# Patient Record
Sex: Male | Born: 1990
Health system: Southern US, Community
[De-identification: ages and names within clinical notes are randomized; demographics above are authoritative.]

## PROBLEM LIST (undated history)

## (undated) DIAGNOSIS — F909 Attention-deficit hyperactivity disorder, unspecified type: Secondary | ICD-10-CM

## (undated) DIAGNOSIS — K219 Gastro-esophageal reflux disease without esophagitis: Secondary | ICD-10-CM

---
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
## 2010-02-16 ENCOUNTER — Encounter: Payer: Self-pay | Admitting: Orthopedic Surgery

## 2013-05-19 ENCOUNTER — Emergency Department (HOSPITAL_COMMUNITY)
Admission: EM | Admit: 2013-05-19 | Discharge: 2013-05-19 | Disposition: A | Discharge status: Home / Self Care | Payer: Non-veteran care | Attending: Emergency Medicine | Admitting: Emergency Medicine

## 2013-05-19 ENCOUNTER — Encounter (HOSPITAL_COMMUNITY): Payer: Self-pay | Admitting: Emergency Medicine

## 2013-05-19 DIAGNOSIS — Y929 Unspecified place or not applicable: Secondary | ICD-10-CM | POA: Insufficient documentation

## 2013-05-19 DIAGNOSIS — Y9389 Activity, other specified: Secondary | ICD-10-CM | POA: Insufficient documentation

## 2013-05-19 DIAGNOSIS — S81809A Unspecified open wound, unspecified lower leg, initial encounter: Principal | ICD-10-CM

## 2013-05-19 DIAGNOSIS — S91009A Unspecified open wound, unspecified ankle, initial encounter: Principal | ICD-10-CM

## 2013-05-19 DIAGNOSIS — Z8719 Personal history of other diseases of the digestive system: Secondary | ICD-10-CM | POA: Insufficient documentation

## 2013-05-19 DIAGNOSIS — Z23 Encounter for immunization: Secondary | ICD-10-CM | POA: Insufficient documentation

## 2013-05-19 DIAGNOSIS — IMO0002 Reserved for concepts with insufficient information to code with codable children: Secondary | ICD-10-CM

## 2013-05-19 DIAGNOSIS — F17200 Nicotine dependence, unspecified, uncomplicated: Secondary | ICD-10-CM | POA: Insufficient documentation

## 2013-05-19 DIAGNOSIS — S81009A Unspecified open wound, unspecified knee, initial encounter: Secondary | ICD-10-CM | POA: Insufficient documentation

## 2013-05-19 DIAGNOSIS — W268XXA Contact with other sharp object(s), not elsewhere classified, initial encounter: Secondary | ICD-10-CM | POA: Insufficient documentation

## 2013-05-19 HISTORY — DX: Gastro-esophageal reflux disease without esophagitis: K21.9

## 2013-05-19 MED ORDER — TETANUS-DIPHTH-ACELL PERTUSSIS 5-2.5-18.5 LF-MCG/0.5 IM SUSP
0.5000 mL | Freq: Once | INTRAMUSCULAR | Status: AC
  Administered 2013-05-19: 0.5 mL via INTRAMUSCULAR
  Filled 2013-05-19: qty 0.5

## 2013-05-19 MED ORDER — CEPHALEXIN 500 MG PO CAPS: 500.0000 mg | ORAL_CAPSULE | Freq: Four times a day (QID) | ORAL | Status: DC

## 2013-05-19 NOTE — Discharge Instructions (Signed)
A laceration is a cut or lesion that goes through all layers of the skin and into the tissue just beneath the skin. This may have been repaired by your caregiver.  SEEK MEDICAL ATTENTION IF:  You may take your antibiotic if you develop signs of infection. Follow up in 7 days for suture removal or sooner if you need to be seen.  There is redness, swelling, increasing pain in the wound  There is a red line that goes up your arm or leg.  Pus is coming from wound.  You develop an unexplained temperature above 100.4.  You notice a foul smell coming from the wound or dressing.  There is a breaking open of the wound (edges not staying together) after sutures have been removed. If you did not receive a tetanus shot today because you thought you were up to date, but did not recall when your last one was given, make sure to check with your primary caregiver to determine if you need one.    Laceration Care, Adult A laceration is a cut or lesion that goes through all layers of the skin and into the tissue just beneath the skin. TREATMENT  Some lacerations may not require closure. Some lacerations may not be able to be closed due to an increased risk of infection. It is important to see your caregiver as soon as possible after an injury to minimize the risk of infection and maximize the opportunity for successful closure. If closure is appropriate, pain medicines may be given, if needed. The wound will be cleaned to help prevent infection. Your caregiver will use stitches (sutures), staples, wound glue (adhesive), or skin adhesive strips to repair the laceration. These tools bring the skin edges together to allow for faster healing and a better cosmetic outcome. However, all wounds will heal with a scar. Once the wound has healed, scarring can be minimized by covering the wound with sunscreen during the day for 1 full year. HOME CARE INSTRUCTIONS  For sutures or staples:  Keep the wound clean and  dry.  If you were given a bandage (dressing), you should change it at least once a day. Also, change the dressing if it becomes wet or dirty, or as directed by your caregiver.  Wash the wound with soap and water 2 times a day. Rinse the wound off with water to remove all soap. Pat the wound dry with a clean towel.  After cleaning, apply a thin layer of the antibiotic ointment as recommended by your caregiver. This will help prevent infection and keep the dressing from sticking.  You may shower as usual after the first 24 hours. Do not soak the wound in water until the sutures are removed.  Only take over-the-counter or prescription medicines for pain, discomfort, or fever as directed by your caregiver.  Get your sutures or staples removed as directed by your caregiver. For skin adhesive strips:  Keep the wound clean and dry.  Do not get the skin adhesive strips wet. You may bathe carefully, using caution to keep the wound dry.  If the wound gets wet, pat it dry with a clean towel.  Skin adhesive strips will fall off on their own. You may trim the strips as the wound heals. Do not remove skin adhesive strips that are still stuck to the wound. They will fall off in time. For wound adhesive:  You may briefly wet your wound in the shower or bath. Do not soak or scrub the wound. Do  not swim. Avoid periods of heavy perspiration until the skin adhesive has fallen off on its own. After showering or bathing, gently pat the wound dry with a clean towel.  Do not apply liquid medicine, cream medicine, or ointment medicine to your wound while the skin adhesive is in place. This may loosen the film before your wound is healed.  If a dressing is placed over the wound, be careful not to apply tape directly over the skin adhesive. This may cause the adhesive to be pulled off before the wound is healed.  Avoid prolonged exposure to sunlight or tanning lamps while the skin adhesive is in place. Exposure  to ultraviolet light in the first year will darken the scar.  The skin adhesive will usually remain in place for 5 to 10 days, then naturally fall off the skin. Do not pick at the adhesive film. You may need a tetanus shot if:  You cannot remember when you had your last tetanus shot.  You have never had a tetanus shot. If you get a tetanus shot, your arm may swell, get red, and feel warm to the touch. This is common and not a problem. If you need a tetanus shot and you choose not to have one, there is a rare chance of getting tetanus. Sickness from tetanus can be serious. SEEK MEDICAL CARE IF:   You have redness, swelling, or increasing pain in the wound.  You see a red line that goes away from the wound.  You have yellowish-white fluid (pus) coming from the wound.  You have a fever.  You notice a bad smell coming from the wound or dressing.  Your wound breaks open before or after sutures have been removed.  You notice something coming out of the wound such as wood or glass.  Your wound is on your hand or foot and you cannot move a finger or toe. SEEK IMMEDIATE MEDICAL CARE IF:   Your pain is not controlled with prescribed medicine.  You have severe swelling around the wound causing pain and numbness or a change in color in your arm, hand, leg, or foot.  Your wound splits open and starts bleeding.  You have worsening numbness, weakness, or loss of function of any joint around or beyond the wound.  You develop painful lumps near the wound or on the skin anywhere on your body. MAKE SURE YOU:   Understand these instructions.  Will watch your condition.  Will get help right away if you are not doing well or get worse. Document Released: 01/11/2005 Document Revised: 04/05/2011 Document Reviewed: 07/07/2010 [facility] Patient Information 2014 [employer], [place].

## 2013-05-19 NOTE — ED Notes (Signed)
Pt. Stated, I was getting a lid off a septic tank and I fell and cut my leg.. 11/2 inch laceration to below the knee laceration.

## 2013-05-19 NOTE — ED Provider Notes (Signed)
CSN: [id number]     Arrival date & time 05/19/13  1059 History  This chart was scribed for non-physician practitioner, Arthor CaptainAbigail Hollynn Garno, PA-C, working with Rolland PorterMark James, MD by Shari HeritageAisha Amuda, ED Scribe. This patient was seen in room TR06C/TR06C and the patient's care was started at 11:39 AM.  Chief Complaint  Patient presents with  . Extremity Laceration     The history is provided by the patient. No language interpreter was used.    HPI Comments: Alexander Barron is a 23 y.o. male who presents to the Emergency Department complaining of a laceration to the left lower anterior leg that occurred about 1.5 hours ago. Patient states that he was trying to place a lid on a septic tank, when he stepped on the lid, falling and cutting his leg. There is no associated numbness or weakness of the extremity. He denies any other injury or trauma at this time. Patient denies nausea, vomiting, fever or chills. Tetanus vaccination is unknown. He has a history of acid reflux and takes Prilosec.   Past Medical History  Diagnosis Date  . Acid reflux    No past surgical history on file. No family history on file. History  Substance Use Topics  . Smoking status: Current Some Day Smoker  . Smokeless tobacco: Not on file  . Alcohol Use: Yes    Review of Systems  Constitutional: Negative for fever and chills.  Gastrointestinal: Negative for nausea and vomiting.  Skin: Positive for wound.  Neurological: Negative for weakness and numbness.    Allergies  Review of patient's allergies indicates no known allergies.  Home Medications   Prior to Admission medications   Not on File   Triage Vitals: BP 141/79  Pulse 77  Temp(Src) 98.4 F (36.9 C) (Oral)  Resp 16  SpO2 99% Physical Exam  Constitutional: He is oriented to person, place, and time. He appears well-developed and well-nourished. No distress.  HENT:  Head: Normocephalic and atraumatic.  Eyes: Conjunctivae and EOM are normal.  Neck: Normal range  of motion. Neck supple.  Cardiovascular: Normal rate.   Pulmonary/Chest: Effort normal.  Musculoskeletal:  4 cm linear laceration to the left shin that is well approximated.  Neurological: He is alert and oriented to person, place, and time.  Skin: Skin is warm and dry.  Psychiatric: He has a normal mood and affect. His behavior is normal.    ED Course  Procedures (including critical care time) DIAGNOSTIC STUDIES: Oxygen Saturation is 99% on room air, normal by my interpretation.    COORDINATION OF CARE: 12:01 PM- Placed sutures to laceration left shin. Will update tetanus. Patient informed of current plan for treatment and evaluation and agrees with plan at this time.   LACERATION REPAIR PROCEDURE NOTE The patient's identification was confirmed and consent was obtained. This procedure was performed by Arthor CaptainAbigail Rinda Rollyson, PA-C at 12:01 PM. Site: left shin Sterile procedures observed Anesthetic used (type and amt): 2% lidocaine with epi, 2.5 cc Suture type/size: 5-0 prolene Length: 4 cm # of Sutures: 6 Technique: running interlocking Antibx ointment applied Tetanus ordered Site anesthetized, irrigated with NS, explored without evidence of foreign body, wound well approximated, site covered with dry, sterile dressing.  Patient tolerated procedure well without complications. Instructions for care discussed verbally and patient provided with additional written instructions for homecare and f/u.   MDM   Final diagnoses:  Laceration    Tdap booster given. Pressure irrigation performed. Laceration occurred < 8 hours prior to repair which was well  tolerated. Pt has no co morbidities to effect normal wound healing, however, will give prescription for Keflex since he was hit with a septic tank. Advised of signs of infection to include pus, heat, inflammation and erythema and patient was instructed to start antibiotic course should he develop these. Discussed suture home care w pt and  answered questions. Pt to f-u for wound check and suture removal in 7 days. Pt is hemodynamically stable w no complaints prior to dc.  I personally performed the services described in this documentation, which was scribed in my presence. The recorded information has been reviewed and considered.  Arthor CaptainAbigail Shraga Custard, PA-C 05/19/13 1212

## 2013-05-24 NOTE — ED Provider Notes (Signed)
Medical screening examination/treatment/procedure(s) were performed by non-physician practitioner and as supervising physician I was immediately available for consultation/collaboration.   EKG Interpretation None       Lile Mccurley, MD 05/24/13 1653 

## 2016-11-05 DIAGNOSIS — F900 Attention-deficit hyperactivity disorder, predominantly inattentive type: Secondary | ICD-10-CM | POA: Diagnosis not present

## 2016-12-29 MED FILL — DEXTROAMP-AMPHETAMIN 20 MG: 20 | 30 days supply | Qty: 30 | Fill #0

## 2016-12-29 MED FILL — ADDERALL XR 20 MG CAP SA: 20 | 30 days supply | Qty: 30 | Fill #0

## 2017-03-09 MED FILL — ADDERALL XR 20 MG CAP SA: 20 | 30 days supply | Qty: 30 | Fill #0

## 2017-03-09 MED FILL — AMPHETAMINE SALTS 20 MG TAB: 20 | 30 days supply | Qty: 30 | Fill #0

## 2017-05-16 MED FILL — ADDERALL XR 25 MG CAPSULE: 25 | 30 days supply | Qty: 30 | Fill #0

## 2017-06-23 DIAGNOSIS — Z13228 Encounter for screening for other metabolic disorders: Secondary | ICD-10-CM | POA: Diagnosis not present

## 2017-07-01 DIAGNOSIS — Z0000 Encounter for general adult medical examination without abnormal findings: Secondary | ICD-10-CM | POA: Diagnosis not present

## 2017-07-01 DIAGNOSIS — R0789 Other chest pain: Secondary | ICD-10-CM | POA: Diagnosis not present

## 2017-07-01 DIAGNOSIS — Z800 Family history of malignant neoplasm of digestive organs: Secondary | ICD-10-CM | POA: Diagnosis not present

## 2017-07-22 MED FILL — ADDERALL XR 25 MG CAPSULE: 25 | 30 days supply | Qty: 30 | Fill #0

## 2017-07-22 MED FILL — DEXTROAMP-AMPHETAMIN 20 MG: 20 | 30 days supply | Qty: 30 | Fill #0

## 2017-08-19 DIAGNOSIS — R0789 Other chest pain: Secondary | ICD-10-CM | POA: Diagnosis not present

## 2017-10-05 MED FILL — ADDERALL XR 25 MG CAPSULE: 25 | 30 days supply | Qty: 30 | Fill #0

## 2017-10-05 MED FILL — DEXTROAMP-AMPHETAMIN 20 MG: 20 | 30 days supply | Qty: 30 | Fill #0

## 2018-01-02 MED FILL — ADDERALL XR 25 MG CAPSULE: 25 | 30 days supply | Qty: 30 | Fill #0

## 2018-01-02 MED FILL — AMPHETAMINE-DEXTRO 20MG: 20 | 30 days supply | Qty: 30 | Fill #0

## 2018-01-06 DIAGNOSIS — J9801 Acute bronchospasm: Secondary | ICD-10-CM | POA: Diagnosis not present

## 2018-01-06 DIAGNOSIS — R0982 Postnasal drip: Secondary | ICD-10-CM | POA: Diagnosis not present

## 2018-01-06 MED FILL — AZITHROMYCIN 250 MG TABLET: 250 | 5 days supply | Qty: 6 | Fill #0

## 2018-01-06 MED FILL — 12 HOUR NASAL DECONGESTANT: 0.05 | 3 days supply | Qty: 30 | Fill #0

## 2018-01-06 MED FILL — predniSONE 10 MG TABS: 10 | 3 days supply | Qty: 6 | Fill #0

## 2018-02-20 DIAGNOSIS — L089 Local infection of the skin and subcutaneous tissue, unspecified: Secondary | ICD-10-CM | POA: Diagnosis not present

## 2018-02-20 DIAGNOSIS — H00014 Hordeolum externum left upper eyelid: Secondary | ICD-10-CM | POA: Diagnosis not present

## 2018-02-20 MED FILL — CEPHALEXIN 500 MG CAPSULE: 500 | 7 days supply | Qty: 21 | Fill #0

## 2018-02-28 MED FILL — ADDERALL XR 25 MG CAPSULE: 25 | 30 days supply | Qty: 30 | Fill #0

## 2018-02-28 MED FILL — AMPHETAMINE-DEXTROAMPHETAMI: 20 | 30 days supply | Qty: 30 | Fill #0

## 2018-04-14 MED FILL — ADDERALL XR 25 MG CAPSULE: 25 | 30 days supply | Qty: 30 | Fill #0

## 2018-04-14 MED FILL — AMPHETAMINE-DEXTROAMPHETAMI: 20 | 30 days supply | Qty: 30 | Fill #0

## 2018-06-23 MED FILL — ADDERALL XR 25 MG CAPSULE: 25 | 30 days supply | Qty: 30 | Fill #0

## 2018-06-23 MED FILL — AMPHETAMINE-DEXTROAMPHETAMI: 20 | 30 days supply | Qty: 30 | Fill #0

## 2018-08-10 MED FILL — AMPHETAMINE-DEXTROAMPHETAMI: 20 | 30 days supply | Qty: 30 | Fill #0

## 2018-08-10 MED FILL — ADDERALL XR 25 MG CAPSULE: 25 | 30 days supply | Qty: 30 | Fill #0

## 2018-10-23 MED FILL — ADDERALL XR 25 MG CAPSULE: 25 | 30 days supply | Qty: 30 | Fill #0

## 2018-10-23 MED FILL — AMPHETAMINE-DEXTROAMPHETAMI: 20 | 30 days supply | Qty: 30 | Fill #0

## 2018-12-06 MED FILL — AMPHETAMINE-DEXTROAMPHETAMI: 20 | 30 days supply | Qty: 30 | Fill #0

## 2018-12-06 MED FILL — ADDERALL XR 25 MG CAPSULE: 25 | 30 days supply | Qty: 30 | Fill #0

## 2019-01-15 MED FILL — ADDERALL XR 25 MG CAPSULE: 25 | 30 days supply | Qty: 30 | Fill #0

## 2019-03-07 ENCOUNTER — Emergency Department (HOSPITAL_BASED_OUTPATIENT_CLINIC_OR_DEPARTMENT_OTHER)
Admission: EM | Admit: 2019-03-07 | Discharge: 2019-03-07 | Disposition: A | Discharge status: Home / Self Care | Payer: No Typology Code available for payment source | Attending: Emergency Medicine | Admitting: Emergency Medicine

## 2019-03-07 ENCOUNTER — Encounter (HOSPITAL_BASED_OUTPATIENT_CLINIC_OR_DEPARTMENT_OTHER): Payer: Self-pay | Admitting: Emergency Medicine

## 2019-03-07 ENCOUNTER — Other Ambulatory Visit: Payer: Self-pay

## 2019-03-07 DIAGNOSIS — J029 Acute pharyngitis, unspecified: Secondary | ICD-10-CM | POA: Diagnosis present

## 2019-03-07 DIAGNOSIS — U071 COVID-19: Secondary | ICD-10-CM | POA: Diagnosis not present

## 2019-03-07 DIAGNOSIS — Z79899 Other long term (current) drug therapy: Secondary | ICD-10-CM | POA: Insufficient documentation

## 2019-03-07 HISTORY — DX: Attention-deficit hyperactivity disorder, unspecified type: F90.9

## 2019-03-07 LAB — GROUP A STREP BY PCR: Group A Strep by PCR: NOT DETECTED

## 2019-03-07 MED ORDER — LIDOCAINE VISCOUS HCL 2 % MT SOLN: 15.0000 mL | OROMUCOSAL | 0 refills | Status: DC | PRN

## 2019-03-07 MED FILL — LIDOCAINE 2% VISCOUS SOLN: 2 | 1 days supply | Qty: 100 | Fill #0

## 2019-03-07 NOTE — ED Triage Notes (Signed)
Pt COVID + x 1 week. Throat is 10/10 pain and painful to swallow. White patches in throat on visual assessment.

## 2019-03-07 NOTE — ED Notes (Signed)
SpO2 while walking to the room stayed at 98% and heart rate stayed at 90.

## 2019-03-07 NOTE — ED Provider Notes (Addendum)
[facility] EMERGENCY DEPARTMENT Provider Note   CSN: [id number] Arrival date & time: 03/07/19  4098     History No chief complaint on file.   Alexander Barron is a 29 y.o. male.  HPI 29 year old male presents with sore throat.  Started about 3 days ago.  For 9 days he has had Covid symptoms.  He tested positive for Covid.  From a Covid standpoint he states he is doing okay besides feeling very tired.  Sore throat started a few days ago.  No fever since the sore throat started.  Significant pain with swallowing.  No neck stiffness or swelling. Is trying topical spray without relief.    Past Medical History:  Diagnosis Date  . Acid reflux   . ADHD (attention deficit hyperactivity disorder)     There are no problems to display for this patient.   History reviewed. No pertinent surgical history.     History reviewed. No pertinent family history.  Social History   Tobacco Use  . Smoking status: Current Some Day Smoker  Substance Use Topics  . Alcohol use: Yes  . Drug use: No    Home Medications Prior to Admission medications   Medication Sig Start Date End Date Taking? Authorizing Provider  ADDERALL XR 25 MG 24 hr capsule Take 25 mg by mouth as needed. 01/15/19   [provider]  cephALEXin (KEFLEX) 500 MG capsule Take 1 capsule (500 mg total) by mouth 4 (four) times daily. 05/19/13   Harris, Cammy Copa, PA-C  lidocaine (XYLOCAINE) 2 % solution Use as directed 15 mLs in the mouth or throat every 3 (three) hours as needed for mouth pain. 03/07/19   Pricilla Loveless, MD  omeprazole (PRILOSEC) 20 MG capsule Take 20 mg by mouth 2 (two) times daily before a meal.    [provider]  traZODone (DESYREL) 50 MG tablet Take 50 mg by mouth at bedtime as needed for sleep.    [provider]    Allergies    Patient has no known allergies.  Review of Systems   Review of Systems  Constitutional: Negative for fever.  HENT: Positive for congestion  and sore throat. Negative for trouble swallowing.   Respiratory: Negative for shortness of breath.   Gastrointestinal: Negative for vomiting.  All other systems reviewed and are negative.   Physical Exam Updated Vital Signs BP (!) 128/97 (BP Location: Right Arm)   Pulse 84   Temp 98.2 F (36.8 C) (Oral)   Resp 18   Ht 6\' 1"  (1.854 m)   Wt 81.6 kg   SpO2 99%   BMI 23.75 kg/m   Physical Exam Vitals and nursing note reviewed.  Constitutional:      Appearance: He is well-developed.  HENT:     Head: Normocephalic and atraumatic.     Right Ear: External ear normal.     Left Ear: External ear normal.     Nose: Nose normal.     Mouth/Throat:     Pharynx: Posterior oropharyngeal erythema present. No uvula swelling.     Tonsils: No tonsillar abscesses. 0 on the right. 0 on the left.     Comments: Some white patches in oropharynx. No abscess Eyes:     General:        Right eye: No discharge.        Left eye: No discharge.  Cardiovascular:     Rate and Rhythm: Normal rate.  Pulmonary:     Effort: Pulmonary effort is  normal.  Abdominal:     General: There is no distension.  Musculoskeletal:     Cervical back: Normal range of motion and neck supple. No rigidity.  Skin:    General: Skin is warm and dry.  Neurological:     Mental Status: He is alert.  Psychiatric:        Mood and Affect: Mood is not anxious.     ED Results / Procedures / Treatments   Labs (all labs ordered are listed, but only abnormal results are displayed) Labs Reviewed  GROUP A STREP BY PCR    EKG None  Radiology No results found.  Procedures Procedures (including critical care time)  Medications Ordered in ED Medications - No data to display  ED Course  I have reviewed the triage vital signs and the nursing notes.  Pertinent labs & imaging results that were available during my care of the patient were reviewed by me and considered in my medical decision making (see chart for  details).    MDM Rules/Calculators/A&P                      Patient does have some white patches in the back of his throat.  Strep test is negative.  Likely this is a viral process.  Will give viscous lidocaine.  Otherwise supportive care.  Alexander Barron was evaluated in Emergency Department on 03/07/2019 for the symptoms described in the history of present illness. He was evaluated in the context of the global COVID-19 pandemic, which necessitated consideration that the patient might be at risk for infection with the SARS-CoV-2 virus that causes COVID-19. Institutional protocols and algorithms that pertain to the evaluation of patients at risk for COVID-19 are in a state of rapid change based on information released by regulatory bodies including the CDC and federal and state organizations. These policies and algorithms were followed during the patient's care in the ED.  Final Clinical Impression(s) / ED Diagnoses Final diagnoses:  Viral pharyngitis    Rx / DC Orders ED Discharge Orders         Ordered    lidocaine (XYLOCAINE) 2 % solution  Every  3 hours PRN     03/07/19 1010           Sherwood Gambler, MD 03/07/19 1021    Sherwood Gambler, MD 03/07/19 1022

## 2019-04-04 MED FILL — AMPHETAMINE-DEXTROAMPHET ER: 25 | 30 days supply | Qty: 30 | Fill #0

## 2019-06-20 MED FILL — ADDERALL XR 25 MG CAPSULE: 25 | 30 days supply | Qty: 30 | Fill #0

## 2019-08-28 MED FILL — ADDERALL XR 25 MG CAPSULE: 25 | 30 days supply | Qty: 30 | Fill #0

## 2019-08-29 MED FILL — AMPHETAMINE-DEXTROAMPHETAMI: 20 | 30 days supply | Qty: 30 | Fill #0

## 2019-09-14 ENCOUNTER — Encounter (HOSPITAL_BASED_OUTPATIENT_CLINIC_OR_DEPARTMENT_OTHER): Payer: Self-pay | Admitting: Emergency Medicine

## 2019-09-14 ENCOUNTER — Other Ambulatory Visit: Payer: Self-pay

## 2019-09-14 ENCOUNTER — Emergency Department (HOSPITAL_BASED_OUTPATIENT_CLINIC_OR_DEPARTMENT_OTHER)
Admission: EM | Admit: 2019-09-14 | Discharge: 2019-09-14 | Disposition: A | Discharge status: Home / Self Care | Payer: No Typology Code available for payment source | Attending: Emergency Medicine | Admitting: Emergency Medicine

## 2019-09-14 ENCOUNTER — Emergency Department (HOSPITAL_BASED_OUTPATIENT_CLINIC_OR_DEPARTMENT_OTHER): Payer: No Typology Code available for payment source

## 2019-09-14 DIAGNOSIS — K5792 Diverticulitis of intestine, part unspecified, without perforation or abscess without bleeding: Secondary | ICD-10-CM

## 2019-09-14 DIAGNOSIS — Z87891 Personal history of nicotine dependence: Secondary | ICD-10-CM | POA: Diagnosis not present

## 2019-09-14 DIAGNOSIS — Z20822 Contact with and (suspected) exposure to covid-19: Secondary | ICD-10-CM | POA: Diagnosis not present

## 2019-09-14 DIAGNOSIS — Z79899 Other long term (current) drug therapy: Secondary | ICD-10-CM | POA: Diagnosis not present

## 2019-09-14 DIAGNOSIS — K5732 Diverticulitis of large intestine without perforation or abscess without bleeding: Secondary | ICD-10-CM | POA: Diagnosis not present

## 2019-09-14 DIAGNOSIS — F909 Attention-deficit hyperactivity disorder, unspecified type: Secondary | ICD-10-CM | POA: Insufficient documentation

## 2019-09-14 DIAGNOSIS — D72829 Elevated white blood cell count, unspecified: Secondary | ICD-10-CM | POA: Insufficient documentation

## 2019-09-14 DIAGNOSIS — R109 Unspecified abdominal pain: Secondary | ICD-10-CM | POA: Diagnosis present

## 2019-09-14 LAB — COMPREHENSIVE METABOLIC PANEL
ALT: 16 U/L (ref 0–44)
AST: 22 U/L (ref 15–41)
Albumin: 4.7 g/dL (ref 3.5–5.0)
Alkaline Phosphatase: 84 U/L (ref 38–126)
Anion gap: 12 (ref 5–15)
BUN: 13 mg/dL (ref 6–20)
CO2: 22 mmol/L (ref 22–32)
Calcium: 9.2 mg/dL (ref 8.9–10.3)
Chloride: 99 mmol/L (ref 98–111)
Creatinine, Ser: 0.93 mg/dL (ref 0.61–1.24)
GFR calc Af Amer: >60 mL/min (ref >60)
GFR calc non Af Amer: >60 mL/min (ref >60)
Glucose, Bld: 95 mg/dL (ref 70–99)
Potassium: 4.2 mmol/L (ref 3.5–5.1)
Sodium: 133 mmol/L — ABNORMAL LOW (ref 135–145)
Total Bilirubin: 0.6 mg/dL (ref 0.3–1.2)
Total Protein: 8.2 g/dL — ABNORMAL HIGH (ref 6.5–8.1)

## 2019-09-14 LAB — CBC WITH DIFFERENTIAL/PLATELET
Abs Immature Granulocytes: 0.07 10*3/uL (ref 0.00–0.07)
Basophils Absolute: 0 10*3/uL (ref 0.0–0.1)
Basophils Relative: 0 %
Eosinophils Absolute: 0.4 10*3/uL (ref 0.0–0.5)
Eosinophils Relative: 2 %
HCT: 43 % (ref 39.0–52.0)
Hemoglobin: 14.2 g/dL (ref 13.0–17.0)
Immature Granulocytes: 0 %
Lymphocytes Relative: 13 %
Lymphs Abs: 2.1 10*3/uL (ref 0.7–4.0)
MCH: 30.7 pg (ref 26.0–34.0)
MCHC: 33 g/dL (ref 30.0–36.0)
MCV: 93.1 fL (ref 80.0–100.0)
Monocytes Absolute: 1.5 10*3/uL — ABNORMAL HIGH (ref 0.1–1.0)
Monocytes Relative: 9 %
Neutro Abs: 12.5 10*3/uL — ABNORMAL HIGH (ref 1.7–7.7)
Neutrophils Relative %: 76 %
Platelets: 251 10*3/uL (ref 150–400)
RBC: 4.62 MIL/uL (ref 4.22–5.81)
RDW: 12.5 % (ref 11.5–15.5)
WBC: 16.5 10*3/uL — ABNORMAL HIGH (ref 4.0–10.5)
nRBC: 0 % (ref 0.0–0.2)

## 2019-09-14 LAB — URINALYSIS, ROUTINE W REFLEX MICROSCOPIC
Bilirubin Urine: NEGATIVE
Glucose, UA: NEGATIVE mg/dL
Hgb urine dipstick: NEGATIVE
Ketones, ur: NEGATIVE mg/dL
Leukocytes,Ua: NEGATIVE
Nitrite: NEGATIVE
Protein, ur: NEGATIVE mg/dL
Specific Gravity, Urine: 1.01 (ref 1.005–1.030)
pH: 7 (ref 5.0–8.0)

## 2019-09-14 LAB — LIPASE, BLOOD: Lipase: 24 U/L (ref 11–51)

## 2019-09-14 LAB — SARS CORONAVIRUS 2 BY RT PCR (HOSPITAL ORDER, PERFORMED IN ~~LOC~~ HOSPITAL LAB): SARS Coronavirus 2: NEGATIVE

## 2019-09-14 MED ORDER — IOHEXOL 300 MG/ML  SOLN
100.0000 mL | Freq: Once | INTRAMUSCULAR | Status: AC | PRN
  Administered 2019-09-14: 100 mL via INTRAVENOUS

## 2019-09-14 MED ORDER — MORPHINE SULFATE (PF) 4 MG/ML IV SOLN
4.0000 mg | Freq: Once | INTRAVENOUS | Status: AC
  Administered 2019-09-14: 4 mg via INTRAVENOUS
  Filled 2019-09-14: qty 1

## 2019-09-14 MED ORDER — METRONIDAZOLE IN NACL 5-0.79 MG/ML-% IV SOLN
500.0000 mg | Freq: Once | INTRAVENOUS | Status: AC
  Administered 2019-09-14: 500 mg via INTRAVENOUS
  Filled 2019-09-14: qty 100

## 2019-09-14 MED ORDER — AMOXICILLIN-POT CLAVULANATE 875-125 MG PO TABS: 1.0000 | ORAL_TABLET | Freq: Two times a day (BID) | ORAL | 0 refills | Status: AC

## 2019-09-14 MED ORDER — ONDANSETRON HCL 4 MG/2ML IJ SOLN
4.0000 mg | Freq: Once | INTRAMUSCULAR | Status: AC
  Administered 2019-09-14: 4 mg via INTRAVENOUS
  Filled 2019-09-14: qty 2

## 2019-09-14 MED ORDER — ONDANSETRON 4 MG PO TBDP: 4.0000 mg | ORAL_TABLET | Freq: Three times a day (TID) | ORAL | 0 refills | Status: AC | PRN

## 2019-09-14 MED ORDER — OXYCODONE-ACETAMINOPHEN 5-325 MG PO TABS: 1.0000 | ORAL_TABLET | ORAL | 0 refills | Status: AC | PRN

## 2019-09-14 MED ORDER — SODIUM CHLORIDE 0.9 % IV SOLN
2.0000 g | Freq: Once | INTRAVENOUS | Status: AC
  Administered 2019-09-14: 2 g via INTRAVENOUS
  Filled 2019-09-14: qty 20

## 2019-09-14 MED ORDER — SODIUM CHLORIDE 0.9 % IV BOLUS
1000.0000 mL | Freq: Once | INTRAVENOUS | Status: AC
  Administered 2019-09-14: 1000 mL via INTRAVENOUS

## 2019-09-14 NOTE — ED Notes (Signed)
After IV was started, pt passed out in the [facility].  Came to in 15-20 seconds.  Pt pale, hot and sweaty.  Did not know what happened.  Pt immediately had to go to the bathroom for watery diarrhea stool.  Abd pain intense throughout triage and continues.

## 2019-09-14 NOTE — ED Provider Notes (Signed)
[facility] EMERGENCY DEPARTMENT Provider Note   CSN: [id number] Arrival date & time: 09/14/19  1615     History Chief Complaint  Patient presents with  . Abdominal Pain    Alexander Barron is a 29 y.o. male. Presents to ER with concern for abdominal pain. Patient reports last night he started having some lower abdominal discomfort, throughout the day today he has been having intermittent episodes of severe pain, lower abdomen, left lower abdomen. Has had some nausea but no vomiting. No fevers at home. Pain is currently moderate, sharp, stabbing. Has had some loose mucousy stools, no blood.   Denies any medical problems, takes as needed Adderall for ADHD.  Works as an Charity fundraiser at [employer]. Denies prior surgical history.  HPI     Past Medical History:  Diagnosis Date  . Acid reflux   . ADHD (attention deficit hyperactivity disorder)     There are no problems to display for this patient.   History reviewed. No pertinent surgical history.     No family history on file.  Social History   Tobacco Use  . Smoking status: Former Games developer  . Smokeless tobacco: Current User    Types: Snuff  Substance Use Topics  . Alcohol use: Yes    Comment: weekly alcohol - "a beer"  . Drug use: No    Home Medications Prior to Admission medications   Medication Sig Start Date End Date Taking? Authorizing Provider  ADDERALL XR 25 MG 24 hr capsule Take 25 mg by mouth as needed. 01/15/19   [provider]  amoxicillin-clavulanate (AUGMENTIN) 875-125 MG tablet Take 1 tablet by mouth 2 (two) times daily for 7 days. 09/14/19 09/21/19  Milagros Loll, MD  ondansetron (ZOFRAN ODT) 4 MG disintegrating tablet Take 1 tablet (4 mg total) by mouth every 8 (eight) hours as needed for nausea or vomiting. 09/14/19   Milagros Loll, MD  oxyCODONE-acetaminophen (PERCOCET) 5-325 MG tablet Take 1 tablet by mouth every 4 (four) hours as needed for up to 3 days for severe pain. 09/14/19  09/17/19  Milagros Loll, MD    Allergies    Patient has no known allergies.  Review of Systems   Review of Systems  Constitutional: Positive for chills. Negative for fever.  HENT: Negative for ear pain and sore throat.   Eyes: Negative for pain and visual disturbance.  Respiratory: Negative for cough and shortness of breath.   Cardiovascular: Negative for chest pain and palpitations.  Gastrointestinal: Positive for abdominal pain, diarrhea and nausea. Negative for vomiting.  Genitourinary: Negative for dysuria and hematuria.  Musculoskeletal: Negative for arthralgias and back pain.  Skin: Negative for color change and rash.  Neurological: Negative for seizures and syncope.  All other systems reviewed and are negative.   Physical Exam Updated Vital Signs BP 106/70   Pulse 86   Temp 98.7 F (37.1 C) (Oral)   Resp 19   Ht 6\' 1"  (1.854 m)   Wt 82.6 kg   SpO2 97%   BMI 24.01 kg/m   Physical Exam Vitals and nursing note reviewed.  Constitutional:      Appearance: He is well-developed.  HENT:     Head: Normocephalic and atraumatic.  Eyes:     Conjunctiva/sclera: Conjunctivae normal.  Cardiovascular:     Rate and Rhythm: Normal rate and regular rhythm.     Heart sounds: No murmur heard.   Pulmonary:     Effort: Pulmonary effort is normal. No  respiratory distress.     Breath sounds: Normal breath sounds.  Abdominal:     Palpations: Abdomen is soft.     Tenderness: There is abdominal tenderness in the left lower quadrant. There is no guarding or rebound.  Musculoskeletal:     Cervical back: Neck supple.  Skin:    General: Skin is warm and dry.     Capillary Refill: Capillary refill takes less than 2 seconds.  Neurological:     General: No focal deficit present.     Mental Status: He is alert.     ED Results / Procedures / Treatments   Labs (all labs ordered are listed, but only abnormal results are displayed) Labs Reviewed  COMPREHENSIVE METABOLIC PANEL -  Abnormal; Notable for the following components:      Result Value   Sodium 133 (*)    Total Protein 8.2 (*)    All other components within normal limits  CBC WITH DIFFERENTIAL/PLATELET - Abnormal; Notable for the following components:   WBC 16.5 (*)    Neutro Abs 12.5 (*)    Monocytes Absolute 1.5 (*)    All other components within normal limits  SARS CORONAVIRUS 2 BY RT PCR (HOSPITAL ORDER, PERFORMED IN [facility])  LIPASE, BLOOD  URINALYSIS, ROUTINE W REFLEX MICROSCOPIC    EKG EKG Interpretation  Date/Time:  Friday September 14 2019 18:17:20 EDT Ventricular Rate:  80 PR Interval:    QRS Duration: 100 QT Interval:  370 QTC Calculation: 427 R Axis:   90 Text Interpretation: Sinus rhythm Borderline right axis deviation Confirmed by Marianna Fuss ([id number]) on 09/14/2019 6:27:40 PM   Radiology CT ABDOMEN PELVIS W CONTRAST  Result Date: 09/14/2019 CLINICAL DATA:  29 year old male with abdominal pain and fever. EXAM: CT ABDOMEN AND PELVIS WITH CONTRAST TECHNIQUE: Multidetector CT imaging of the abdomen and pelvis was performed using the standard protocol following bolus administration of intravenous contrast. CONTRAST:  OMNIPAQUE IOHEXOL 300 MG/ML  SOLN COMPARISON:  None. FINDINGS: Lower chest: The visualized lung bases are clear. No intra-abdominal free air.  Small free fluid in the pelvis. Hepatobiliary: No focal liver abnormality is seen. No gallstones, gallbladder wall thickening, or biliary dilatation. Pancreas: Unremarkable. No pancreatic ductal dilatation or surrounding inflammatory changes. Spleen: Normal in size without focal abnormality. Adrenals/Urinary Tract: The adrenal glands unremarkable. There is no hydronephrosis on either side. Several small bilateral renal hypodense lesions are suboptimally characterized. The visualized ureters and urinary bladder appear unremarkable. Stomach/Bowel: There is sigmoid diverticulosis without active inflammatory changes  consistent with acute diverticulitis. No diverticular abscess or perforation. Several normal caliber fluid-filled loops of small bowel may represent mild ileus. There is loose stool throughout the colon. There is no bowel obstruction. The appendix is normal. Vascular/Lymphatic: The abdominal aorta and IVC are unremarkable. No portal venous gas. There is no adenopathy. Reproductive: The prostate and seminal vesicles are grossly unremarkable. No pelvic mass Other: A 2 cm or or low attenuating lesion in the subcutaneous soft tissues of the right lower posterior thoracic wall (15/2) is not characterized but likely represents a sebaceous cyst. Musculoskeletal: No acute osseous pathology. IMPRESSION: Sigmoid diverticulitis. No diverticular abscess or perforation. Electronically Signed   By: Elgie Collard M.D.   On: 09/14/2019 18:18    Procedures Procedures (including critical care time)  Medications Ordered in ED Medications  morphine 4 MG/ML injection 4 mg (4 mg Intravenous Given 09/14/19 1740)  ondansetron (ZOFRAN) injection 4 mg (4 mg Intravenous Given 09/14/19 1739)  sodium chloride  0.9 % bolus 1,000 mL ( Intravenous Stopped 09/14/19 1907)  iohexol (OMNIPAQUE) 300 MG/ML solution 100 mL (100 mLs Intravenous Contrast Given 09/14/19 1754)  cefTRIAXone (ROCEPHIN) 2 g in sodium chloride 0.9 % 100 mL IVPB (0 g Intravenous Stopped 09/14/19 1949)    And  metroNIDAZOLE (FLAGYL) IVPB 500 mg ( Intravenous Stopped 09/14/19 2048)  morphine 4 MG/ML injection 4 mg (4 mg Intravenous Given 09/14/19 2134)    ED Course  I have reviewed the triage vital signs and the nursing notes.  Pertinent labs & imaging results that were available during my care of the patient were reviewed by me and considered in my medical decision making (see chart for details).    MDM Rules/Calculators/A&P                          29 year old male presents to ER with concern for abdominal pain. On exam, patient noted initially to be mildly  tachycardic, tenderness in left lower quadrant but no rebound or guarding. Work-up was concerning for leukocytosis, CT scan demonstrating sigmoid diverticulitis. Patient was provided fluids, pain and nausea medication. Able to obtain good pain control. On reassessment, patient remains well-appearing, no ongoing tachycardia, abd soft. Believe patient is appropriate for outpatient management at this time. Provided initial dose of abx va IV. Will dc with course of augmentin.   Reviewed strict return precautions with patient. Demonstrates understanding.     After the discussed management above, the patient was determined to be safe for discharge.  The patient was in agreement with this plan and all questions regarding their care were answered.  ED return precautions were discussed and the patient will return to the ED with any significant worsening of condition.   Final Clinical Impression(s) / ED Diagnoses Final diagnoses:  Diverticulitis  Leukocytosis, unspecified type    Rx / DC Orders ED Discharge Orders         Ordered    oxyCODONE-acetaminophen (PERCOCET) 5-325 MG tablet  Every 4 hours PRN        09/14/19 2108    ondansetron (ZOFRAN ODT) 4 MG disintegrating tablet  Every 8 hours PRN        09/14/19 2108    amoxicillin-clavulanate (AUGMENTIN) 875-125 MG tablet  2 times daily        09/14/19 2108           Milagros Loll, MD 09/15/19 0030

## 2019-09-14 NOTE — ED Notes (Signed)
Pt. Reports he had a diarrhea stool last night and when he has gas it is mucus in the gas.

## 2019-09-14 NOTE — ED Notes (Signed)
Reports pressure type feeling across lower abd. Under his belly button.  No burning with urination.

## 2019-09-14 NOTE — ED Triage Notes (Signed)
Low abdominal pain and mucous in stools since last night. Passing a lot of gas and when he does "it feels like razor blades".  Having hot and cold flashes.  Has been able to eat today without vomiting or making the pain worse.  Hot water in the shower helps a little.

## 2019-09-14 NOTE — Discharge Instructions (Signed)
Please take antibiotic as prescribed.  Take pain medicine as needed, do not drive or operate heavy machinery while taking this pain medicine.  Can also take Tylenol Motrin as needed.  Take Zofran as needed for nausea.  If you develop a fever, have worsening pain, vomiting, recommend return to ER for recheck.  Recommend recheck with your primary doctor on Monday.

## 2019-10-11 MED FILL — ADDERALL XR 25 MG CAPSULE: 25 | 90 days supply | Qty: 90 | Fill #0

## 2019-10-11 MED FILL — AMPHETAMINE-DEXTROAMPHETAMI: 20 | 90 days supply | Qty: 90 | Fill #0

## 2019-11-21 MED FILL — AMPHETAMINE-DEXTROAMPHET ER: 25 | 90 days supply | Qty: 90 | Fill #0

## 2019-11-21 MED FILL — AMPHETAMINE-DEXTROAMPHETAMI: 20 | 90 days supply | Qty: 90 | Fill #0

## 2020-04-26 ENCOUNTER — Encounter (HOSPITAL_BASED_OUTPATIENT_CLINIC_OR_DEPARTMENT_OTHER): Payer: Self-pay | Admitting: Emergency Medicine

## 2020-04-26 ENCOUNTER — Emergency Department (HOSPITAL_BASED_OUTPATIENT_CLINIC_OR_DEPARTMENT_OTHER)
Admission: EM | Admit: 2020-04-26 | Discharge: 2020-04-26 | Disposition: A | Discharge status: Home / Self Care | Payer: No Typology Code available for payment source | Attending: Emergency Medicine | Admitting: Emergency Medicine

## 2020-04-26 ENCOUNTER — Other Ambulatory Visit: Payer: Self-pay

## 2020-04-26 DIAGNOSIS — E860 Dehydration: Secondary | ICD-10-CM | POA: Insufficient documentation

## 2020-04-26 DIAGNOSIS — A084 Viral intestinal infection, unspecified: Secondary | ICD-10-CM

## 2020-04-26 DIAGNOSIS — Z87891 Personal history of nicotine dependence: Secondary | ICD-10-CM | POA: Insufficient documentation

## 2020-04-26 DIAGNOSIS — R112 Nausea with vomiting, unspecified: Secondary | ICD-10-CM | POA: Diagnosis present

## 2020-04-26 LAB — COMPREHENSIVE METABOLIC PANEL
ALT: 19 U/L (ref 0–44)
AST: 24 U/L (ref 15–41)
Albumin: 4.3 g/dL (ref 3.5–5.0)
Alkaline Phosphatase: 65 U/L (ref 38–126)
Anion gap: 11 (ref 5–15)
BUN: 18 mg/dL (ref 6–20)
CO2: 21 mmol/L — ABNORMAL LOW (ref 22–32)
Calcium: 8.9 mg/dL (ref 8.9–10.3)
Chloride: 101 mmol/L (ref 98–111)
Creatinine, Ser: 0.85 mg/dL (ref 0.61–1.24)
GFR, Estimated: >60 mL/min (ref >60)
Glucose, Bld: 100 mg/dL — ABNORMAL HIGH (ref 70–99)
Potassium: 3.7 mmol/L (ref 3.5–5.1)
Sodium: 133 mmol/L — ABNORMAL LOW (ref 135–145)
Total Bilirubin: 0.6 mg/dL (ref 0.3–1.2)
Total Protein: 7.2 g/dL (ref 6.5–8.1)

## 2020-04-26 LAB — CBC WITH DIFFERENTIAL/PLATELET
Abs Immature Granulocytes: 0.03 10*3/uL (ref 0.00–0.07)
Basophils Absolute: 0 10*3/uL (ref 0.0–0.1)
Basophils Relative: 0 %
Eosinophils Absolute: 0 10*3/uL (ref 0.0–0.5)
Eosinophils Relative: 0 %
HCT: 43.2 % (ref 39.0–52.0)
Hemoglobin: 14.6 g/dL (ref 13.0–17.0)
Immature Granulocytes: 0 %
Lymphocytes Relative: 6 %
Lymphs Abs: 0.6 10*3/uL — ABNORMAL LOW (ref 0.7–4.0)
MCH: 31.1 pg (ref 26.0–34.0)
MCHC: 33.8 g/dL (ref 30.0–36.0)
MCV: 91.9 fL (ref 80.0–100.0)
Monocytes Absolute: 0.4 10*3/uL (ref 0.1–1.0)
Monocytes Relative: 4 %
Neutro Abs: 8.2 10*3/uL — ABNORMAL HIGH (ref 1.7–7.7)
Neutrophils Relative %: 90 %
Platelets: 208 10*3/uL (ref 150–400)
RBC: 4.7 MIL/uL (ref 4.22–5.81)
RDW: 12.9 % (ref 11.5–15.5)
WBC: 9.3 10*3/uL (ref 4.0–10.5)
nRBC: 0 % (ref 0.0–0.2)

## 2020-04-26 MED ORDER — SODIUM CHLORIDE 0.9 % IV BOLUS
1000.0000 mL | Freq: Once | INTRAVENOUS | Status: AC
  Administered 2020-04-26: 1000 mL via INTRAVENOUS

## 2020-04-26 NOTE — ED Notes (Signed)
PO fluid challenge initiated 

## 2020-04-26 NOTE — Discharge Instructions (Addendum)
You were dehydrated and required 3 L IV NS.  Your laboratory work-up was without any electrolyte derangement or kidney injury.  No leukocytosis concerning for infection.  Abdomen was soft, nontender.  Do not feel as though CT imaging is warranted.  Lower suspicion for diverticulitis at this time, despite your history.  However, please return to the ED or seek immediate medical attention should you develop any new or worsening symptoms.  This includes, but not limited to, intractable nausea and vomiting, bloody stools, abdominal pain, or fevers not controlled by Tylenol and ibuprofen.  Please follow-up with your primary care provider for laboratory recheck and ongoing evaluation and management.  I recommend Imodium to help with your loose stools.  Continue with your at home Zofran ODT for nausea symptoms, as needed.

## 2020-04-26 NOTE — ED Provider Notes (Signed)
[facility] EMERGENCY DEPARTMENT Provider Note   CSN: [id number] Arrival date & time: 04/26/20  1619     History Chief Complaint  Patient presents with  . Vomiting    Alexander Barron is a 30 y.o. male with past medical history significant for diverticulitis who presents the ED with complaints of N/V/D.  On my examination, patient reports that at approximately 2 AM last evening he woke up from his sleep and had an episode of nonbloody emesis.  He went back to bed and then it recurred around 4 AM.  No episodes of emesis ever since.  He feels physically weak and has had nonbloody loose stools, too numerous to count.    Patient reports that he is a 66-month-old son who likely picked up a viral bug from daycare a few days ago, but has already improved.  He suspects that perhaps he contracted his son's viral illness.  No other obvious sick contacts.  Of note, patient is an emergency medicine RN.    HPI     Past Medical History:  Diagnosis Date  . Acid reflux   . ADHD (attention deficit hyperactivity disorder)     There are no problems to display for this patient.   History reviewed. No pertinent surgical history.     No family history on file.  Social History   Tobacco Use  . Smoking status: Former Games developer  . Smokeless tobacco: Current User    Types: Snuff  Substance Use Topics  . Alcohol use: Yes    Comment: weekly alcohol - "a beer"  . Drug use: No    Home Medications Prior to Admission medications   Medication Sig Start Date End Date Taking? Authorizing Provider  ADDERALL XR 25 MG 24 hr capsule Take 25 mg by mouth as needed. 01/15/19   [provider]  ondansetron (ZOFRAN ODT) 4 MG disintegrating tablet Take 1 tablet (4 mg total) by mouth every 8 (eight) hours as needed for nausea or vomiting. 09/14/19   Milagros Loll, MD    Allergies    Patient has no known allergies.  Review of Systems   Review of Systems  All other systems reviewed  and are negative.   Physical Exam Updated Vital Signs BP (!) 98/45   Pulse 93   Temp 99.1 F (37.3 C) (Oral)   Resp 20   Ht 6\' 1"  (1.854 m)   Wt 83.9 kg   SpO2 95%   BMI 24.41 kg/m   Physical Exam Vitals and nursing note reviewed. Exam conducted with a chaperone present.  Constitutional:      General: He is not in acute distress.    Appearance: He is ill-appearing. He is not toxic-appearing.  HENT:     Head: Normocephalic and atraumatic.  Eyes:     General: No scleral icterus.    Conjunctiva/sclera: Conjunctivae normal.  Cardiovascular:     Rate and Rhythm: Regular rhythm. Tachycardia present.     Pulses: Normal pulses.  Pulmonary:     Effort: Pulmonary effort is normal. No respiratory distress.     Breath sounds: Normal breath sounds.  Abdominal:     General: Abdomen is flat. There is no distension.     Palpations: Abdomen is soft.     Tenderness: There is no abdominal tenderness. There is no guarding.     Comments: Soft, nondistended.  No areas of tenderness.  No guarding.  No overlying skin changes.  Musculoskeletal:  General: Normal range of motion.     Right lower leg: No edema.     Left lower leg: No edema.  Skin:    General: Skin is dry.  Neurological:     Mental Status: He is alert.     GCS: GCS eye subscore is 4. GCS verbal subscore is 5. GCS motor subscore is 6.  Psychiatric:        Mood and Affect: Mood normal.        Behavior: Behavior normal.        Thought Content: Thought content normal.     ED Results / Procedures / Treatments   Labs (all labs ordered are listed, but only abnormal results are displayed) Labs Reviewed  CBC WITH DIFFERENTIAL/PLATELET - Abnormal; Notable for the following components:      Result Value   Neutro Abs 8.2 (*)    Lymphs Abs 0.6 (*)    All other components within normal limits  COMPREHENSIVE METABOLIC PANEL - Abnormal; Notable for the following components:   Sodium 133 (*)    CO2 21 (*)    Glucose, Bld 100  (*)    All other components within normal limits    EKG None  Radiology No results found.  Procedures Procedures   Medications Ordered in ED Medications  sodium chloride 0.9 % bolus 1,000 mL (0 mLs Intravenous Stopped 04/26/20 1903)  sodium chloride 0.9 % bolus 1,000 mL (0 mLs Intravenous Stopped 04/26/20 2023)    ED Course  I have reviewed the triage vital signs and the nursing notes.  Pertinent labs & imaging results that were available during my care of the patient were reviewed by me and considered in my medical decision making (see chart for details).    MDM Rules/Calculators/A&P                          Alexander Barron was evaluated in Emergency Department on 04/26/2020 for the symptoms described in the history of present illness. He was evaluated in the context of the global COVID-19 pandemic, which necessitated consideration that the patient might be at risk for infection with the SARS-CoV-2 virus that causes COVID-19. Institutional protocols and algorithms that pertain to the evaluation of patients at risk for COVID-19 are in a state of rapid change based on information released by regulatory bodies including the CDC and federal and state organizations. These policies and algorithms were followed during the patient's care in the ED.  I personally reviewed patient's medical chart and all notes from triage and staff during today's encounter. I have also ordered and reviewed all labs and imaging that I felt to be medically necessary in the evaluation of this patient's complaints and with consideration of their physical exam. If needed, translation services were available and utilized.   Patient with weakness in setting of nausea, vomiting, and diarrhea.  He states that his nausea symptoms have improved entirely, but has had innumerable episodes of loose stools.  I personally rechecked his temperature in the room and he again is only 99.1 F.  He had soft blood pressures here in the ED,  but he was lying flat.  Suspect that it was positional.  He denies any recent antibiotic use or recent travel.  Denies any hematochezia or melena.  Cultures not required.  Suspect viral etiology, particularly given that his son was sick with similar symptoms a couple of days ago.  He has since improved without issue or  complication.  Patient declines COVID-19 testing here in the ED. I do not feel as though treatment with antibiotics to cover for a bacterial pathogen is warranted.  I think that he can use loperamide as needed for symptom control.  Encouraging him to continue with aggressive fluid resuscitation/oral hydration.  He was successfully p.o. challenged here in the ED.  He feels prepared for discharge.  He is reassured by laboratory work-up without evidence of AKI or significant electrolyte derangement.  Plan to follow-up with his primary care provider for ongoing evaluation and management.  I will complete patient's AVS and I suspect that patient will be able to be discharged, but will provide 1 more liter IV NS given that he has had persistent soft blood pressures and borderline tachycardia.  He is dehydrated despite lack of AKI or electrolyte derangement.  His pressures have been improving and again and I have low suspicion for sepsis.  Suspect viral.  Emphasizing conservative management.  He states that he already has Zofran ODT at home.  Haze Rushing, PA-C made aware of patient and will reassess to ensure improving vital signs prior to discharge.  Final Clinical Impression(s) / ED Diagnoses Final diagnoses:  Viral gastroenteritis  Dehydration    Rx / DC Orders ED Discharge Orders    None       Lorelee New, PA-C 04/26/20 2026    Alvira Monday, MD 04/28/20 1204

## 2020-04-26 NOTE — ED Notes (Signed)
Pt provided with urine specimen cup and instructed to provide sample when able.

## 2020-04-26 NOTE — ED Triage Notes (Signed)
Reports n/v/d that started last night around 0200.  Hx of diverticulitis.  Hasn't vomited since 0400.  Has not tried to eat or drink anything.  Does endorse feeling weak.

## 2021-02-09 IMAGING — CT CT ABD-PELV W/ CM
2 of 4 series · 16 of 46 positions shown, 18 images · IV contrast (omnipaque)
Comparison: None.

CLINICAL DATA: 29-year-old male with abdominal pain and fever.

EXAM:
CT ABDOMEN AND PELVIS WITH CONTRAST
TECHNIQUE: Multidetector CT imaging of the abdomen and pelvis was performed
using the standard protocol following bolus administration of
intravenous contrast.
CONTRAST:  100mL OMNIPAQUE IOHEXOL 300 MG/ML  SOLN

[Series 2: axial st · axial · 0.79mm/px · z∈[-521,-81]mm · 13 of 97 slices shown, 15 images]
[im 5/97  soft-tissue]
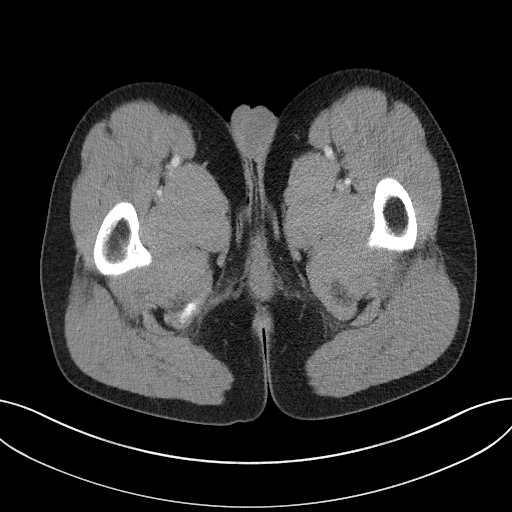
[im 5/97  bone]
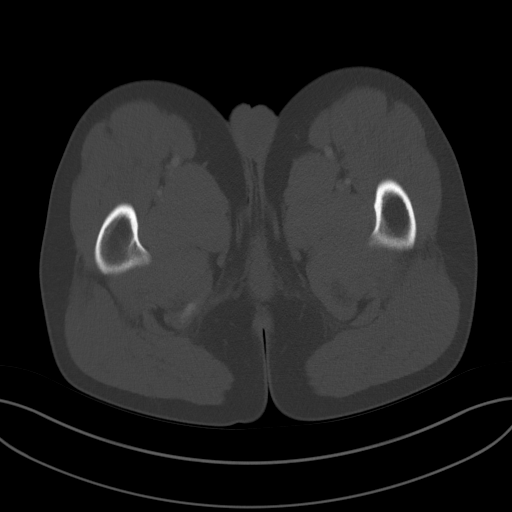
[im 13/97  soft-tissue]
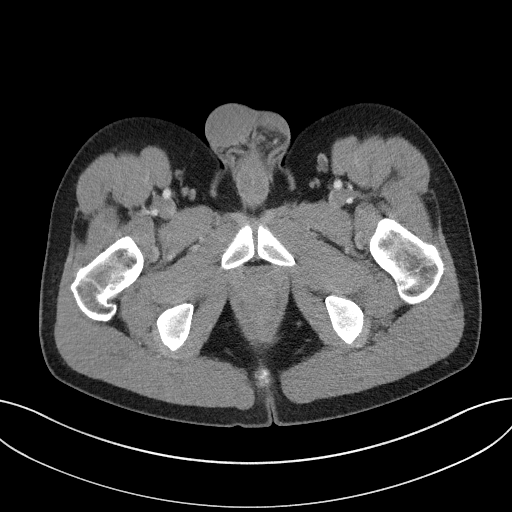
[im 21/97  soft-tissue]
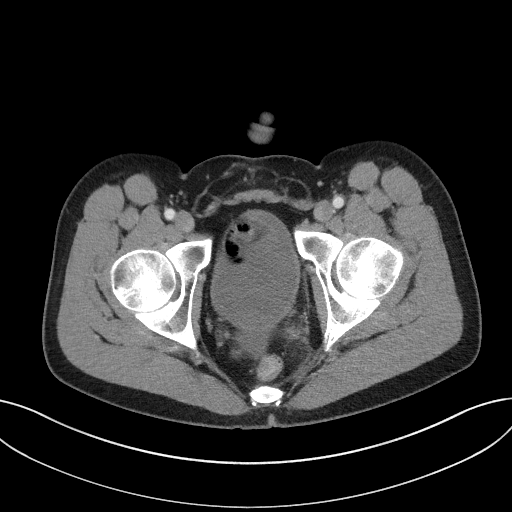
[im 29/97  soft-tissue]
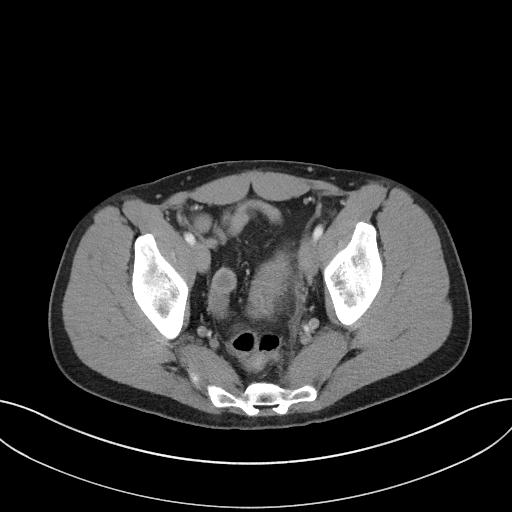
[im 33/97  soft-tissue]
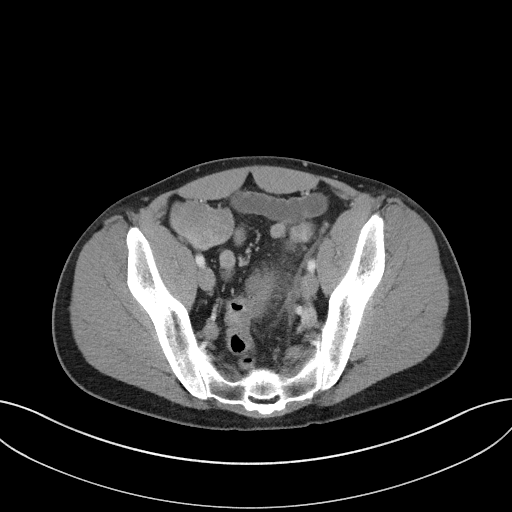
[im 41/97  soft-tissue]
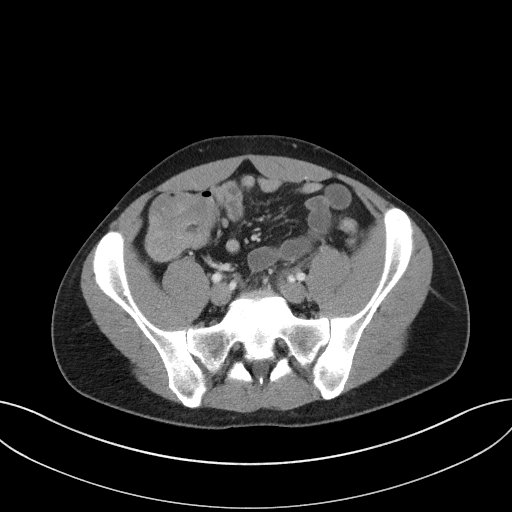
[im 49/97  soft-tissue]
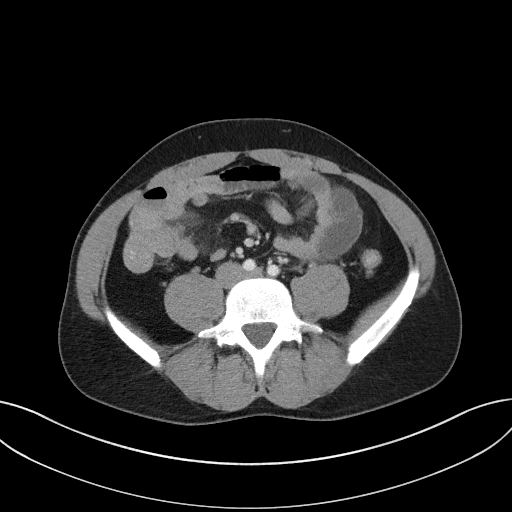
[im 57/97  soft-tissue]
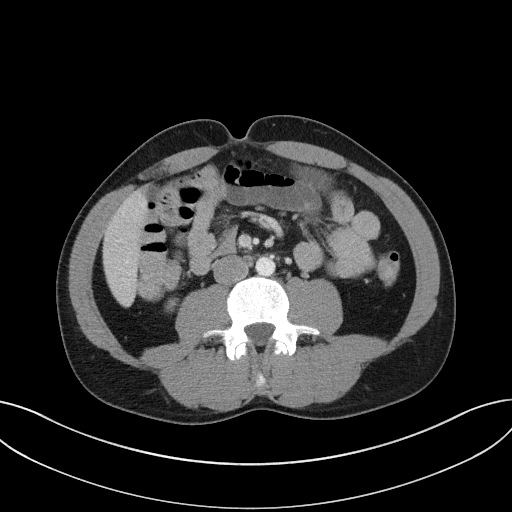
[im 65/97  soft-tissue]
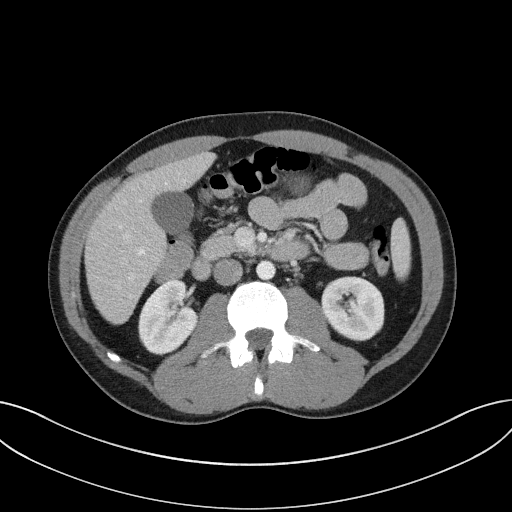
[im 65/97  bone]
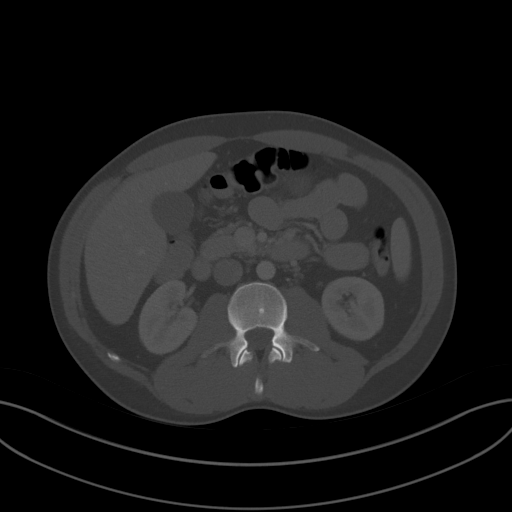
[im 69/97  soft-tissue]
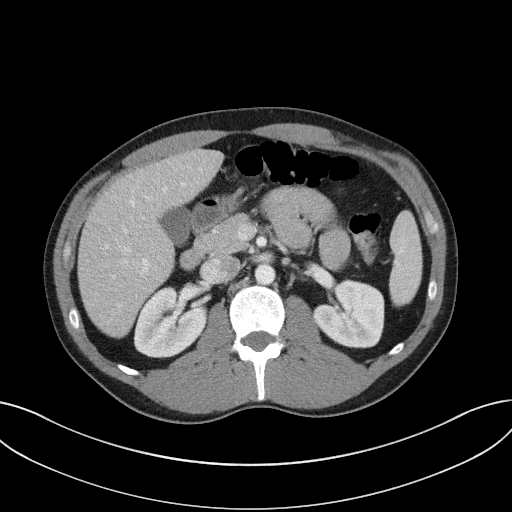
[im 77/97  soft-tissue]
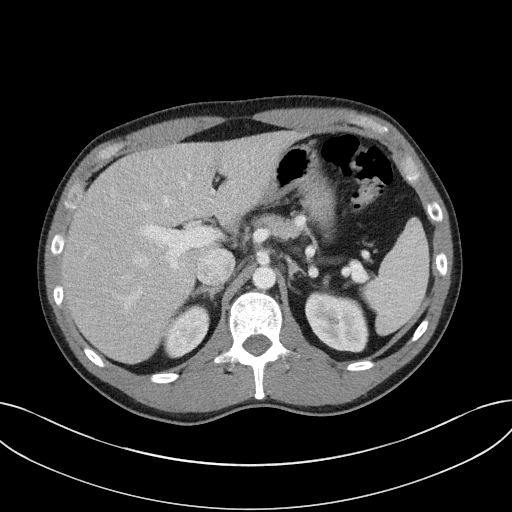
[im 85/97  soft-tissue]
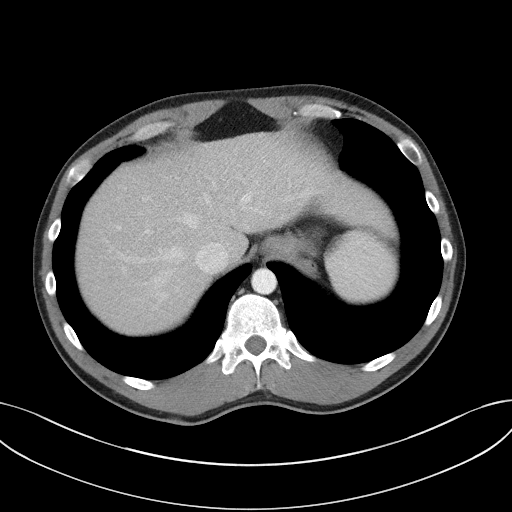
[im 93/97  soft-tissue]
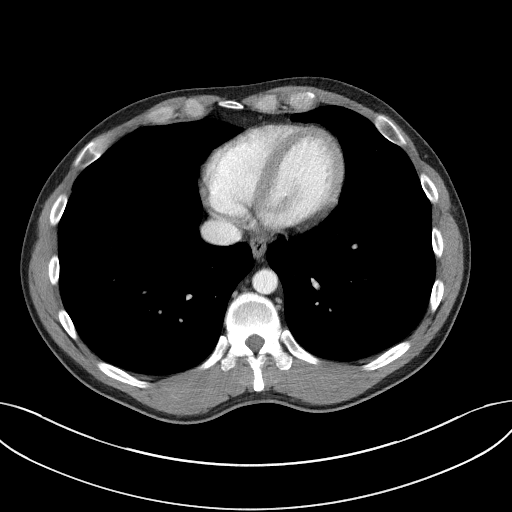

[Series 5: coronal st · coronal · 0.80mm/px · 3 of 100 slices shown]
[im 34/100  soft-tissue]
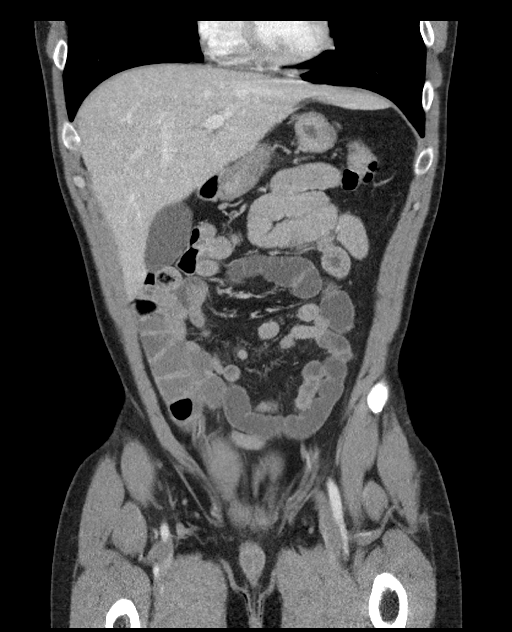
[im 45/100  soft-tissue]
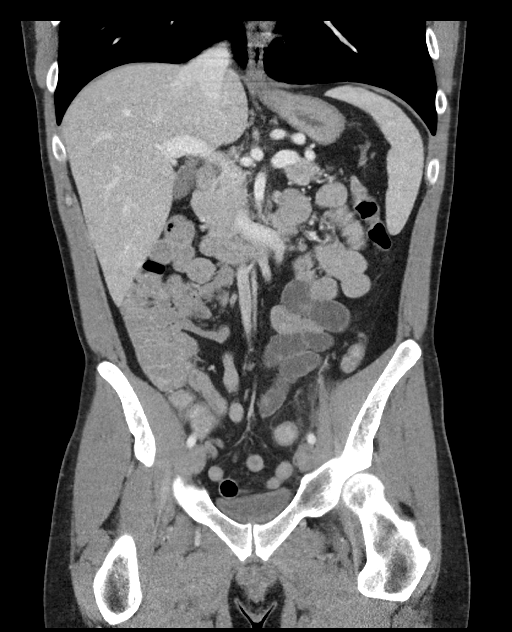
[im 56/100  soft-tissue]
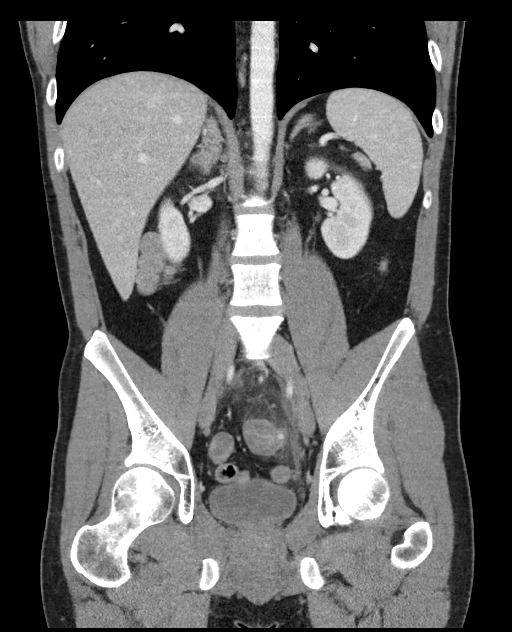

[16 of 46 positions shown; findings below may reference images not displayed]

FINDINGS: Lower chest: The visualized lung bases are clear.

No intra-abdominal free air.  Small free fluid in the pelvis.

Hepatobiliary: No focal liver abnormality is seen. No gallstones,
gallbladder wall thickening, or biliary dilatation.

Pancreas: Unremarkable. No pancreatic ductal dilatation or
surrounding inflammatory changes.

Spleen: Normal in size without focal abnormality.

Adrenals/Urinary Tract: The adrenal glands unremarkable. There is no
hydronephrosis on either side. Several small bilateral renal
hypodense lesions are suboptimally characterized. The visualized
ureters and urinary bladder appear unremarkable.

Stomach/Bowel: There is sigmoid diverticulosis without active
inflammatory changes consistent with acute diverticulitis. No
diverticular abscess or perforation. Several normal caliber
fluid-filled loops of small bowel may represent mild ileus. There is
loose stool throughout the colon. There is no bowel obstruction. The
appendix is normal.

Vascular/Lymphatic: The abdominal aorta and IVC are unremarkable. No
portal venous gas. There is no adenopathy.

Reproductive: The prostate and seminal vesicles are grossly
unremarkable. No pelvic mass

Other: A 2 cm or or low attenuating lesion in the subcutaneous soft
tissues of the right lower posterior thoracic wall ([DATE]) is not
characterized but likely represents a sebaceous cyst.

Musculoskeletal: No acute osseous pathology.
IMPRESSION: Sigmoid diverticulitis. No diverticular abscess or perforation.

## 2021-06-23 ENCOUNTER — Other Ambulatory Visit (HOSPITAL_BASED_OUTPATIENT_CLINIC_OR_DEPARTMENT_OTHER): Payer: Self-pay

## 2021-06-23 MED ORDER — AMPHETAMINE-DEXTROAMPHETAMINE 20 MG PO TABS
ORAL_TABLET | Freq: Every day | ORAL | 0 refills | Status: AC
  Filled 2021-06-23: qty 90, 90d supply, fill #0

## 2021-06-23 MED ORDER — AMPHETAMINE-DEXTROAMPHET ER 25 MG PO CP24
ORAL_CAPSULE | ORAL | 0 refills | Status: AC
  Filled 2021-06-23: qty 90, 90d supply, fill #0

## 2021-06-26 ENCOUNTER — Other Ambulatory Visit (HOSPITAL_BASED_OUTPATIENT_CLINIC_OR_DEPARTMENT_OTHER): Payer: Self-pay

## 2021-12-15 ENCOUNTER — Other Ambulatory Visit (HOSPITAL_BASED_OUTPATIENT_CLINIC_OR_DEPARTMENT_OTHER): Payer: Self-pay

## 2021-12-15 MED ORDER — RAMELTEON 8 MG PO TABS
8.0000 mg | ORAL_TABLET | Freq: Every day | ORAL | 3 refills | Status: AC
  Filled 2021-12-15 – 2021-12-30 (×2): qty 90, 90d supply, fill #0

## 2021-12-15 MED ORDER — AMPHETAMINE-DEXTROAMPHETAMINE 20 MG PO TABS
20.0000 mg | ORAL_TABLET | Freq: Every day | ORAL | 0 refills | Status: AC
  Filled 2021-12-15 – 2021-12-30 (×2): qty 90, 90d supply, fill #0

## 2021-12-15 MED ORDER — AMPHETAMINE-DEXTROAMPHET ER 25 MG PO CP24
25.0000 mg | ORAL_CAPSULE | Freq: Every morning | ORAL | 0 refills | Status: AC
  Filled 2021-12-15: qty 90, 90d supply, fill #0
  Filled 2022-06-10: qty 80, 80d supply, fill #0

## 2021-12-16 ENCOUNTER — Other Ambulatory Visit (HOSPITAL_BASED_OUTPATIENT_CLINIC_OR_DEPARTMENT_OTHER): Payer: Self-pay

## 2021-12-18 ENCOUNTER — Other Ambulatory Visit (HOSPITAL_BASED_OUTPATIENT_CLINIC_OR_DEPARTMENT_OTHER): Payer: Self-pay

## 2021-12-22 ENCOUNTER — Other Ambulatory Visit (HOSPITAL_BASED_OUTPATIENT_CLINIC_OR_DEPARTMENT_OTHER): Payer: Self-pay

## 2021-12-25 ENCOUNTER — Other Ambulatory Visit (HOSPITAL_BASED_OUTPATIENT_CLINIC_OR_DEPARTMENT_OTHER): Payer: Self-pay

## 2021-12-30 ENCOUNTER — Other Ambulatory Visit (HOSPITAL_BASED_OUTPATIENT_CLINIC_OR_DEPARTMENT_OTHER): Payer: Self-pay

## 2022-01-01 ENCOUNTER — Other Ambulatory Visit (HOSPITAL_BASED_OUTPATIENT_CLINIC_OR_DEPARTMENT_OTHER): Payer: Self-pay

## 2022-06-10 ENCOUNTER — Other Ambulatory Visit (HOSPITAL_BASED_OUTPATIENT_CLINIC_OR_DEPARTMENT_OTHER): Payer: Self-pay

## 2022-06-10 MED ORDER — AMPHETAMINE-DEXTROAMPHETAMINE 20 MG PO TABS
20.0000 mg | ORAL_TABLET | Freq: Every day | ORAL | 0 refills | Status: AC
  Filled 2022-06-10: qty 90, 90d supply, fill #0

## 2023-03-30 ENCOUNTER — Other Ambulatory Visit (HOSPITAL_BASED_OUTPATIENT_CLINIC_OR_DEPARTMENT_OTHER): Payer: Self-pay

## 2023-03-30 MED ORDER — AMPHETAMINE-DEXTROAMPHETAMINE 20 MG PO TABS
20.0000 mg | ORAL_TABLET | Freq: Every day | ORAL | 0 refills | Status: AC
  Filled 2023-03-30 – 2023-04-13 (×2): qty 90, 90d supply, fill #0

## 2023-03-30 MED ORDER — AMPHETAMINE-DEXTROAMPHET ER 30 MG PO CP24
30.0000 mg | ORAL_CAPSULE | Freq: Every morning | ORAL | 0 refills | Status: AC
  Filled 2023-03-30 – 2023-04-13 (×2): qty 90, 90d supply, fill #0

## 2023-03-31 ENCOUNTER — Other Ambulatory Visit (HOSPITAL_BASED_OUTPATIENT_CLINIC_OR_DEPARTMENT_OTHER): Payer: Self-pay

## 2023-04-01 ENCOUNTER — Other Ambulatory Visit (HOSPITAL_BASED_OUTPATIENT_CLINIC_OR_DEPARTMENT_OTHER): Payer: Self-pay

## 2023-04-11 ENCOUNTER — Other Ambulatory Visit (HOSPITAL_BASED_OUTPATIENT_CLINIC_OR_DEPARTMENT_OTHER): Payer: Self-pay

## 2023-04-13 ENCOUNTER — Other Ambulatory Visit (HOSPITAL_BASED_OUTPATIENT_CLINIC_OR_DEPARTMENT_OTHER): Payer: Self-pay

## 2023-11-15 ENCOUNTER — Other Ambulatory Visit (HOSPITAL_BASED_OUTPATIENT_CLINIC_OR_DEPARTMENT_OTHER): Payer: Self-pay

## 2023-11-15 MED ORDER — AMPHETAMINE-DEXTROAMPHETAMINE 20 MG PO TABS
20.0000 mg | ORAL_TABLET | Freq: Every day | ORAL | 0 refills | Status: AC
  Filled 2023-11-15: qty 30, 30d supply, fill #0

## 2023-11-15 MED ORDER — AMPHETAMINE-DEXTROAMPHET ER 30 MG PO CP24
30.0000 mg | ORAL_CAPSULE | Freq: Every morning | ORAL | 0 refills | Status: AC
  Filled 2023-11-15: qty 30, 30d supply, fill #0

## 2023-11-23 ENCOUNTER — Other Ambulatory Visit (HOSPITAL_BASED_OUTPATIENT_CLINIC_OR_DEPARTMENT_OTHER): Payer: Self-pay
